# Patient Record
Sex: Male | Born: 1985 | Race: Black or African American | Hispanic: No | Marital: Single | Smoking: Never smoker
Health system: Southern US, Community
[De-identification: ages and names within clinical notes are randomized; demographics above are authoritative.]

## PROBLEM LIST (undated history)

## (undated) ENCOUNTER — Emergency Department (HOSPITAL_COMMUNITY): Discharge status: Absent without leave | Payer: Self-pay

---
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
## 2007-01-25 ENCOUNTER — Emergency Department (HOSPITAL_COMMUNITY): Admission: EM | Admit: 2007-01-25 | Discharge: 2007-01-25 | Payer: Self-pay | Admitting: Emergency Medicine

## 2008-08-16 ENCOUNTER — Emergency Department (HOSPITAL_COMMUNITY): Admission: EM | Admit: 2008-08-16 | Discharge: 2008-08-16 | Payer: Self-pay | Admitting: Emergency Medicine

## 2009-09-11 ENCOUNTER — Emergency Department (HOSPITAL_COMMUNITY): Admission: EM | Admit: 2009-09-11 | Discharge: 2009-09-11 | Payer: Self-pay | Admitting: Emergency Medicine

## 2011-04-20 NOTE — Op Note (Signed)
NAMEALONZO, OWCZARZAK             ACCOUNT NO.:  [id number]   MEDICAL RECORD NO.:  [id number]          PATIENT TYPE:  AMB   LOCATION:  DSC                          FACILITY:  [facility]   PHYSICIAN:  Angelia Mould. Derrell Lolling, M.D.DATE OF BIRTH:  [birth year]   DATE OF PROCEDURE:  05/01/2006  DATE OF DISCHARGE:                                 OPERATIVE REPORT   PREOPERATIVE DIAGNOSIS:  Soft tissue mass left thigh, 2.8 cm diameter.   POSTOPERATIVE DIAGNOSIS:  Soft tissue mass left thigh, 2.8 cm diameter.   OPERATION PERFORMED:  Excision soft tissue mass left thigh.   SURGEON:  Angelia Mould. Derrell Lolling, M.D.   OPERATIVE INDICATIONS:  This is a 25 year old black male who has a several-  month history of a protrusion in the skin on the upper inner left thigh.  It  has never been infected but has become more painful when he sits on it.  A  couple days ago it actually bled a little bit from the surface.  On exam, he  has what looks like about a 1.5 to 2.0 cm pedunculated mass on the upper  inner left side.  This has the physical characteristics of a benign lipoma  or perhaps a pedunculated cyst.  He is brought to the operating room for  excision.   OPERATIVE TECHNIQUE:  The patient was brought to the minor procedure room at  [facility]  He was placed in the right lateral decubitus  position with his thigh extended and the other thigh flexed. The area was  prepped and draped with Betadine.  1% Xylocaine with epinephrine was used as  a local infiltration anesthetic.  His mother was in attendance throughout  the procedure.  The obvious mass in the upper inner thigh was marked with a  marking pin in a transverse elliptical fashion. A transverse elliptical  incision was made.  The incision itself was probably about 3-1/2 to 4 cm  long and about 2 cm wide.  Dissection was carried down into the subcutaneous  tissue and the mass was completely excised.  There  was no signs of any deep  extension.   This was sent to pathology.  Hemostasis was excellent.  The skin  was closed with about 6 or 7 interrupted sutures of 3-0 and 4-0 nylon.  Clean bandages were placed.  The patient was returned to the waiting room in  excellent condition with his mother.  Estimated blood loss was about 5 mL.  Complications none. Sponge, needle and instrument counts were correct.      Angelia Mould. Derrell Lolling, M.D.  Electronically Signed     HMI/MEDQ  D:  05/01/2006  T:  05/01/2006  Job:  [id number]   cc:   Elvina Sidle, M.D.  Fax: [phone]

## 2014-05-23 ENCOUNTER — Emergency Department (HOSPITAL_COMMUNITY)
Admission: EM | Admit: 2014-05-23 | Discharge: 2014-05-23 | Disposition: A | Discharge status: Home / Self Care | Payer: Self-pay | Attending: Emergency Medicine | Admitting: Emergency Medicine

## 2014-05-23 ENCOUNTER — Encounter (HOSPITAL_COMMUNITY): Payer: Self-pay | Admitting: Emergency Medicine

## 2014-05-23 DIAGNOSIS — S0086XA Insect bite (nonvenomous) of other part of head, initial encounter: Secondary | ICD-10-CM

## 2014-05-23 DIAGNOSIS — Y939 Activity, unspecified: Secondary | ICD-10-CM | POA: Insufficient documentation

## 2014-05-23 DIAGNOSIS — Y929 Unspecified place or not applicable: Secondary | ICD-10-CM | POA: Insufficient documentation

## 2014-05-23 DIAGNOSIS — W57XXXA Bitten or stung by nonvenomous insect and other nonvenomous arthropods, initial encounter: Principal | ICD-10-CM | POA: Insufficient documentation

## 2014-05-23 DIAGNOSIS — S1096XA Insect bite of unspecified part of neck, initial encounter: Secondary | ICD-10-CM | POA: Insufficient documentation

## 2014-05-23 MED ORDER — CEPHALEXIN 500 MG PO CAPS: 500.0000 mg | ORAL_CAPSULE | Freq: Four times a day (QID) | ORAL | Status: DC

## 2014-05-23 NOTE — Discharge Instructions (Signed)
Recommend watchful waiting for 48 hours. If symptoms continue to worsen such as if you develop worsening swelling with redness, heat to the touch compared to surrounding skin, or puslike drainage, began antibiotics as prescribed. Followup with your primary care doctor as needed.  Insect Bite Mosquitoes, flies, fleas, bedbugs, and many other insects can bite. Insect bites are different from insect stings. A sting is when venom is injected into the skin. Some insect bites can transmit infectious diseases. SYMPTOMS  Insect bites usually turn red, swell, and itch for 2 to 4 days. They often go away on their own. TREATMENT  Your caregiver may prescribe antibiotic medicines if a bacterial infection develops in the bite. HOME CARE INSTRUCTIONS  Do not scratch the bite area.  Keep the bite area clean and dry. Wash the bite area thoroughly with soap and water.  Put ice or cool compresses on the bite area.  Put ice in a plastic bag.  Place a towel between your skin and the bag.  Leave the ice on for 20 minutes, 4 times a day for the first 2 to 3 days, or as directed.  You may apply a baking soda paste, cortisone cream, or calamine lotion to the bite area as directed by your caregiver. This can help reduce itching and swelling.  Only take over-the-counter or prescription medicines as directed by your caregiver.  If you are given antibiotics, take them as directed. Finish them even if you start to feel better. You may need a tetanus shot if:  You cannot remember when you had your last tetanus shot.  You have never had a tetanus shot.  The injury broke your skin. If you get a tetanus shot, your arm may swell, get red, and feel warm to the touch. This is common and not a problem. If you need a tetanus shot and you choose not to have one, there is a rare chance of getting tetanus. Sickness from tetanus can be serious. SEEK IMMEDIATE MEDICAL CARE IF:   You have increased pain, redness, or  swelling in the bite area.  You see a red line on the skin coming from the bite.  You have a fever.  You have joint pain.  You have a headache or neck pain.  You have unusual weakness.  You have a rash.  You have chest pain or shortness of breath.  You have abdominal pain, nausea, or vomiting.  You feel unusually tired or sleepy. MAKE SURE YOU:   Understand these instructions.  Will watch your condition.  Will get help right away if you are not doing well or get worse. Document Released: 12/27/2004 Document Revised: 02/11/2012 Document Reviewed: 06/20/2011 [facility] Patient Information 2015 [place] [place]. This information is not intended to replace advice given to you by your health care provider. Make sure you discuss any questions you have with your health care provider.

## 2014-05-23 NOTE — ED Notes (Signed)
Patient is alert and oriented x3.  He was given DC instructions and follow up visit instructions.  Patient gave verbal understanding.  He was DC ambulatory under his own power to home.  V/S stable.  He was not showing any signs of distress on DC 

## 2014-05-23 NOTE — ED Provider Notes (Signed)
CSN: [id number]     Arrival date & time 05/23/14  2051 History  This chart was scribed for non-physician practitioner, Antony MaduraKelly Karmelo Bass, PA-C working with Dagmar HaitWilliam Blair Walden, MD by Greggory StallionKayla Andersen, ED scribe. This patient was seen in room WTR8/WTR8 and the patient's care was started at 9:30 PM.    Chief Complaint  Patient presents with  . Insect Bite   The history is provided by the patient and a parent. No language interpreter was used.   HPI Comments: Chris DeedsJoel Galloway is a 28 y.o. male who presents to the Emergency Department complaining of an insect bite to his face that occurred about one hour ago. He has swelling and mild pain between his eyes. Pt's mother states that it was possibly a spider that bit him but pt is unsure. Pt states he noticed brown spiders in his garage before it happened. Denies vision changes, pain with eye movement, fever, headache, body aches. Pt is unsure of when his last tetanus was.   No past medical history on file. No past surgical history on file. History reviewed. No pertinent family history. History  Substance Use Topics  . Smoking status: Never Smoker   . Smokeless tobacco: Not on file  . Alcohol Use: Not on file    Review of Systems  Constitutional: Negative for fever.  HENT: Positive for facial swelling.   Eyes: Negative for visual disturbance.  Musculoskeletal: Negative for myalgias.  Neurological: Negative for headaches.  All other systems reviewed and are negative.   Allergies  Review of patient's allergies indicates no known allergies.  Home Medications   Prior to Admission medications   Medication Sig Start Date End Date Taking? Authorizing Provider  cephALEXin (KEFLEX) 500 MG capsule Take 1 capsule (500 mg total) by mouth 4 (four) times daily. Start on 05/26/14 if symptoms worsen or signs of infection develop 05/25/14   Antony MaduraKelly Lunah Losasso, PA-C   There were no vitals taken for this visit.  Physical Exam  Nursing note and vitals  reviewed. Constitutional: He is oriented to person, place, and time. He appears well-developed and well-nourished. No distress.  Nontoxic/nonseptic appearing.  HENT:  Head: Normocephalic and atraumatic.  Nose:    Eyes: Conjunctivae and EOM are normal. No scleral icterus.  Neck: Normal range of motion.  Cardiovascular: Normal rate, regular rhythm and intact distal pulses.   Distal radial pulses 2+ b/l  Pulmonary/Chest: Effort normal. No respiratory distress.  Musculoskeletal: Normal range of motion.  Neurological: He is alert and oriented to person, place, and time. He exhibits normal muscle tone. Coordination normal.  GCS 15. Patient moves extremities without ataxia.  Skin: Skin is warm and dry. No rash noted. He is not diaphoretic. No erythema. No pallor.  Psychiatric: He has a normal mood and affect. His behavior is normal.    ED Course  Procedures (including critical care time)  COORDINATION OF CARE: 9:39 PM-Discussed treatment plan which includes an antibiotic with pt at bedside and pt agreed to plan. Return precautions given. Advised pt to wait 48 hours to start the antibiotic to see if symptoms worsen.   Labs Review Labs Reviewed - No data to display  Imaging Review No results found.   EKG Interpretation None      MDM   Final diagnoses:  Bug bite of face without infection, initial encounter    Uncomplicated but bite date. Puncture wound suspected to be secondary to a spider bite. Bite occurred approximately one hour ago. Physical exam significant for mild soft tissue  swelling. No evidence of cellulitis or abscess. No ulceration of bite area. Patient well and nontoxic appearing. He denies fever. Do not see indication for antibiotics at this time; however, mother concerned about the development of severe infection. Have discussed plan of watchful waiting x48 hours. Will prescribe Keflex to be started 48 hours should symptoms severely worsen. Primary care followup  advised and return precautions provided. Patient agreeable to plan with no unaddressed concerns.  I personally performed the services described in this documentation, which was scribed in my presence. The recorded information has been reviewed and is accurate.   Filed Vitals:   05/23/14 2121  BP: 157/97  Pulse: 77  SpO2: 96%     Antony MaduraKelly Wetona Viramontes, PA-C 05/23/14 2154

## 2014-05-23 NOTE — ED Notes (Signed)
Patient is alert and oriented x3.  He is complaining of a bug bite to the face. Patient mother states that it was possibly a spider.  Currently patient has notable  Swelling to the facial area between his eye.

## 2014-05-23 NOTE — ED Provider Notes (Signed)
Medical screening examination/treatment/procedure(s) were performed by non-physician practitioner and as supervising physician I was immediately available for consultation/collaboration.   EKG Interpretation None        William Cloria Ciresi, MD 05/23/14 2319 

## 2015-10-17 ENCOUNTER — Emergency Department (INDEPENDENT_AMBULATORY_CARE_PROVIDER_SITE_OTHER)
Admission: EM | Admit: 2015-10-17 | Discharge: 2015-10-17 | Disposition: A | Discharge status: Home / Self Care | Payer: Self-pay | Source: Home / Self Care | Attending: Family Medicine | Admitting: Family Medicine

## 2015-10-17 ENCOUNTER — Encounter (HOSPITAL_COMMUNITY): Payer: Self-pay | Admitting: *Deleted

## 2015-10-17 DIAGNOSIS — K047 Periapical abscess without sinus: Secondary | ICD-10-CM

## 2015-10-17 MED ORDER — DICLOFENAC POTASSIUM 50 MG PO TABS: 50.0000 mg | ORAL_TABLET | Freq: Three times a day (TID) | ORAL | Status: DC

## 2015-10-17 MED ORDER — CLINDAMYCIN HCL 300 MG PO CAPS: 300.0000 mg | ORAL_CAPSULE | Freq: Three times a day (TID) | ORAL | Status: DC

## 2015-10-17 NOTE — Discharge Instructions (Signed)
Take medicine as prescribed, see your dentist as soon as possible °

## 2015-10-17 NOTE — ED Provider Notes (Signed)
CSN: [id number]     Arrival date & time 10/17/15  1516 History   First MD Initiated Contact with Patient 10/17/15 1717     Chief Complaint  Patient presents with  . Dental Problem   (Consider location/radiation/quality/duration/timing/severity/associated sxs/prior Treatment) Patient is a 29 y.o. male presenting with tooth pain. The history is provided by the patient.  Dental Pain Location:  Upper and generalized Upper teeth location: all of upper teeth poor cond. Quality:  Throbbing Severity:  Moderate Progression:  Worsening Chronicity:  New Context: dental caries and poor dentition   Relieved by:  None tried Worsened by:  Nothing tried Ineffective treatments:  None tried Associated symptoms: gum swelling   Associated symptoms: no facial swelling and no fever   Risk factors: lack of dental care and periodontal disease     No past medical history on file. No past surgical history on file. No family history on file. Social History  Substance Use Topics  . Smoking status: Never Smoker   . Smokeless tobacco: Not on file  . Alcohol Use: Not on file    Review of Systems  Constitutional: Negative for fever.  HENT: Positive for dental problem. Negative for facial swelling.   Hematological: Positive for adenopathy.  All other systems reviewed and are negative.   Allergies  Review of patient's allergies indicates no known allergies.  Home Medications   Prior to Admission medications   Medication Sig Start Date End Date Taking? Authorizing Provider  cephALEXin (KEFLEX) 500 MG capsule Take 1 capsule (500 mg total) by mouth 4 (four) times daily. Start on 05/26/14 if symptoms worsen or signs of infection develop 05/25/14   Antony MaduraKelly Humes, PA-C  clindamycin (CLEOCIN) 300 MG capsule Take 1 capsule (300 mg total) by mouth 3 (three) times daily. 10/17/15   Linna HoffJames D Kindl, MD  diclofenac (CATAFLAM) 50 MG tablet Take 1 tablet (50 mg total) by mouth 3 (three) times daily. 10/17/15   Linna HoffJames D  Kindl, MD   Meds Ordered and Administered this Visit  Medications - No data to display  BP 133/84 mmHg  Pulse 77  Temp(Src) 99.4 F (37.4 C) (Oral)  Resp 16  SpO2 100% No data found.   Physical Exam  Constitutional: He appears well-developed and well-nourished. No distress.  HENT:  Mouth/Throat: Uvula is midline. Abnormal dentition. Dental abscesses and dental caries present.  Nursing note and vitals reviewed.   ED Course  Procedures (including critical care time)  Labs Review Labs Reviewed - No data to display  Imaging Review No results found.   Visual Acuity Review  Right Eye Distance:   Left Eye Distance:   Bilateral Distance:    Right Eye Near:   Left Eye Near:    Bilateral Near:         MDM   1. Dental abscess        Linna HoffJames D Kindl, MD 10/17/15 [phone]

## 2015-10-17 NOTE — ED Notes (Signed)
Pt reports   Multiple       Dental  Caries      And  Pain  In   Mouth         X  3  Weeks  Unable  To  See  Dentist  Due  To  Finances

## 2017-04-18 ENCOUNTER — Encounter (HOSPITAL_COMMUNITY): Payer: Self-pay | Admitting: Emergency Medicine

## 2017-04-18 ENCOUNTER — Emergency Department (HOSPITAL_COMMUNITY)
Admission: EM | Admit: 2017-04-18 | Discharge: 2017-04-18 | Disposition: A | Discharge status: Home / Self Care | Payer: Self-pay | Attending: Emergency Medicine | Admitting: Emergency Medicine

## 2017-04-18 DIAGNOSIS — K0510 Chronic gingivitis, plaque induced: Secondary | ICD-10-CM | POA: Insufficient documentation

## 2017-04-18 DIAGNOSIS — K0889 Other specified disorders of teeth and supporting structures: Secondary | ICD-10-CM | POA: Insufficient documentation

## 2017-04-18 MED ORDER — NAPROXEN 250 MG PO TABS
375.0000 mg | ORAL_TABLET | Freq: Once | ORAL | Status: AC
  Administered 2017-04-18: 375 mg via ORAL
  Filled 2017-04-18: qty 2

## 2017-04-18 MED ORDER — PENICILLIN V POTASSIUM 500 MG PO TABS: 500.0000 mg | ORAL_TABLET | Freq: Four times a day (QID) | ORAL | 0 refills | Status: AC

## 2017-04-18 MED ORDER — NAPROXEN 375 MG PO TABS: 375.0000 mg | ORAL_TABLET | Freq: Two times a day (BID) | ORAL | 0 refills | Status: DC

## 2017-04-18 NOTE — ED Provider Notes (Signed)
[facility]-EMERGENCY DEPT Provider Note   CSN: [id number] Arrival date & time: 04/18/17  1324  By signing my name below, I, Sonum Patel, attest that this documentation has been prepared under the direction and in the presence of [employer]lett-[employer]ardlexandra Gadge Hermiz PA-C. Electronically Signed: Sonum Patel, Neurosurgeoncribe. 04/18/17. 2:29 PM.  History   Chief Complaint Chief Complaint  Patient presents with  . Dental Pain    The history is provided by the patient. No language interpreter was used.     HPI Comments: Chris Galloway is a 31 y.o. male who presents to the Emergency Department complaining of ongoing, worsening left sided upper and frontal dental pain for the past few months. He states he does not brush his teeth regularly as it causes his gums to bleed. He reports drinking 10 cups of soda daily. He has tried motrin, naproxen, and Tylenol without significant relief. He denies associated fever.   History reviewed. No pertinent past medical history.  There are no active problems to display for this patient.   History reviewed. No pertinent surgical history.     Home Medications    Prior to Admission medications   Medication Sig Start Date End Date Taking? Authorizing Provider  cephALEXin (KEFLEX) 500 MG capsule Take 1 capsule (500 mg total) by mouth 4 (four) times daily. Start on 05/26/14 if symptoms worsen or signs of infection develop 05/25/14   Antony MaduraHumes, Kelly, PA-C  clindamycin (CLEOCIN) 300 MG capsule Take 1 capsule (300 mg total) by mouth 3 (three) times daily. 10/17/15   Linna HoffKindl, James D, MD  diclofenac (CATAFLAM) 50 MG tablet Take 1 tablet (50 mg total) by mouth 3 (three) times daily. 10/17/15   Linna HoffKindl, James D, MD  naproxen (NAPROSYN) 375 MG tablet Take 1 tablet (375 mg total) by mouth 2 (two) times daily. 04/18/17   Itzayanna Kaster, Waylan BogaAlexandra M, PA-C  penicillin v potassium (VEETID) 500 MG tablet Take 1 tablet (500 mg total) by mouth 4 (four) times daily. 04/18/17 04/25/17  Emi HolesLaw, Tamaj Jurgens M, PA-C    Family  History History reviewed. No pertinent family history.  Social History Social History  Substance Use Topics  . Smoking status: Never Smoker  . Smokeless tobacco: Never Used  . Alcohol use No     Allergies   Patient has no known allergies.   Review of Systems Review of Systems  Constitutional: Negative for fever.  HENT: Positive for dental problem.      Physical Exam Updated Vital Signs BP (!) 154/99 (BP Location: Right Arm)   Pulse 69   Temp 98.6 F (37 C) (Oral)   Resp 18   SpO2 98%   Physical Exam  Constitutional: He appears well-developed and well-nourished. No distress.  HENT:  Head: Normocephalic and atraumatic.  Mouth/Throat: Oropharynx is clear and moist. Abnormal dentition. No oropharyngeal exudate.  No focal tooth worse than others. Generally poor dentition and erythematous gingiva   Eyes: Conjunctivae are normal. Pupils are equal, round, and reactive to light. Right eye exhibits no discharge. Left eye exhibits no discharge. No scleral icterus.  Neck: Normal range of motion. Neck supple. No thyromegaly present.  Cardiovascular: Normal rate, regular rhythm, normal heart sounds and intact distal pulses.  Exam reveals no gallop and no friction rub.   No murmur heard. Pulmonary/Chest: Effort normal and breath sounds normal. No stridor. No respiratory distress. He has no wheezes. He has no rales.  Abdominal: Soft. He exhibits no distension. There is no tenderness. There is no rebound and no guarding.  Musculoskeletal: He  exhibits no edema.  Lymphadenopathy:    He has no cervical adenopathy.  Neurological: He is alert. Coordination normal.  Skin: Skin is warm and dry. No rash noted. He is not diaphoretic. No pallor.  Psychiatric: He has a normal mood and affect.  Nursing note and vitals reviewed.    ED Treatments / Results  DIAGNOSTIC STUDIES: Oxygen Saturation is 98% on RA, nml by my interpretation.    COORDINATION OF CARE: 2:25 PM Discussed treatment  plan with pt at bedside and pt agreed to plan.   Labs (all labs ordered are listed, but only abnormal results are displayed) Labs Reviewed - No data to display  EKG  EKG Interpretation None       Radiology No results found.  Procedures Procedures (including critical care time)  Medications Ordered in ED Medications  naproxen (NAPROSYN) tablet 375 mg (not administered)     Initial Impression / Assessment and Plan / ED Course  I have reviewed the triage vital signs and the nursing notes.  Pertinent labs & imaging results that were available during my care of the patient were reviewed by me and considered in my medical decision making (see chart for details).     Patient with dentalgia, generalized tooth decay and gingival disease.  No abscess requiring immediate incision and drainage.  Exam not concerning for Ludwig's angina or pharyngeal abscess.  Will treat with penicillin. Pt instructed to follow-up with dentist as soon as possible. I discussed diet and lifestyle changes extensively with patient. He understands and agrees with plan. Discussed return precautions. Pt safe for discharge.   Final Clinical Impressions(s) / ED Diagnoses   Final diagnoses:  Pain, dental  Gingivitis    New Prescriptions New Prescriptions   NAPROXEN (NAPROSYN) 375 MG TABLET    Take 1 tablet (375 mg total) by mouth 2 (two) times daily.   PENICILLIN V POTASSIUM (VEETID) 500 MG TABLET    Take 1 tablet (500 mg total) by mouth 4 (four) times daily.   I personally performed the services described in this documentation, which was scribed in my presence. The recorded information has been reviewed and is accurate.    Emi Holes, PA-C 04/18/17 1504    Tilden Fossa, MD 04/18/17 2105

## 2017-04-18 NOTE — ED Triage Notes (Signed)
Pt sts upper dental pain

## 2017-04-18 NOTE — Discharge Instructions (Signed)
Medications: Penicillin, Naprosyn  Treatment: Take penicillin 4 times daily for 1 week. Make sure to finish all this medication. Take Naprosyn twice daily as needed for your pain. You can alternate with Tylenol as prescribed over-the-counter.  Follow-up: Please follow-up with a dentist as soon as possible for further evaluation and treatment of your dental pain and gingival disease. Please follow up and establish care with the primary care provider by calling the numbers circled on your discharge paperwork.

## 2017-10-31 ENCOUNTER — Encounter (HOSPITAL_COMMUNITY)
Admission: EM | Disposition: A | Discharge status: Home / Self Care | Payer: Self-pay | Source: Home / Self Care | Attending: Emergency Medicine

## 2017-10-31 ENCOUNTER — Other Ambulatory Visit: Payer: Self-pay

## 2017-10-31 ENCOUNTER — Emergency Department (HOSPITAL_COMMUNITY)
Admission: EM | Admit: 2017-10-31 | Discharge: 2017-10-31 | Disposition: A | Discharge status: Home / Self Care | Payer: Self-pay | Attending: Emergency Medicine | Admitting: Emergency Medicine

## 2017-10-31 ENCOUNTER — Emergency Department (HOSPITAL_COMMUNITY): Payer: Self-pay

## 2017-10-31 ENCOUNTER — Encounter (HOSPITAL_COMMUNITY): Payer: Self-pay | Admitting: Certified Registered"

## 2017-10-31 ENCOUNTER — Encounter (HOSPITAL_COMMUNITY): Payer: Self-pay

## 2017-10-31 DIAGNOSIS — W4904XA Ring or other jewelry causing external constriction, initial encounter: Secondary | ICD-10-CM | POA: Insufficient documentation

## 2017-10-31 DIAGNOSIS — Z791 Long term (current) use of non-steroidal anti-inflammatories (NSAID): Secondary | ICD-10-CM | POA: Insufficient documentation

## 2017-10-31 DIAGNOSIS — L923 Foreign body granuloma of the skin and subcutaneous tissue: Secondary | ICD-10-CM | POA: Insufficient documentation

## 2017-10-31 DIAGNOSIS — S61224A Laceration with foreign body of right ring finger without damage to nail, initial encounter: Secondary | ICD-10-CM

## 2017-10-31 SURGERY — IRRIGATION AND DEBRIDEMENT EXTREMITY
Anesthesia: Choice | Laterality: Right

## 2017-10-31 MED ORDER — BUPIVACAINE HCL (PF) 0.5 % IJ SOLN
10.0000 mL | Freq: Once | INTRAMUSCULAR | Status: AC
  Administered 2017-10-31: 10 mL
  Filled 2017-10-31: qty 10

## 2017-10-31 MED ORDER — BUPIVACAINE HCL 0.5 % IJ SOLN
10.0000 mL | Freq: Once | INTRAMUSCULAR | Status: DC
  Filled 2017-10-31 (×2): qty 10

## 2017-10-31 MED ORDER — LACTATED RINGERS IV SOLN: INTRAVENOUS | Status: DC

## 2017-10-31 MED ORDER — SULFAMETHOXAZOLE-TRIMETHOPRIM 800-160 MG PO TABS: 1.0000 | ORAL_TABLET | Freq: Two times a day (BID) | ORAL | 0 refills | Status: DC

## 2017-10-31 MED ORDER — TETANUS-DIPHTH-ACELL PERTUSSIS 5-2.5-18.5 LF-MCG/0.5 IM SUSP
0.5000 mL | Freq: Once | INTRAMUSCULAR | Status: AC
  Administered 2017-10-31: 0.5 mL via INTRAMUSCULAR
  Filled 2017-10-31: qty 0.5

## 2017-10-31 SURGICAL SUPPLY — 44 items
BANDAGE ACE 3X5.8 VEL STRL LF (GAUZE/BANDAGES/DRESSINGS) ×2 IMPLANT
BANDAGE ACE 4X5 VEL STRL LF (GAUZE/BANDAGES/DRESSINGS) ×2 IMPLANT
BANDAGE COBAN STERILE 2 (GAUZE/BANDAGES/DRESSINGS) IMPLANT
BNDG ESMARK 4X9 LF (GAUZE/BANDAGES/DRESSINGS) IMPLANT
BNDG GAUZE ELAST 4 BULKY (GAUZE/BANDAGES/DRESSINGS) ×2 IMPLANT
CORDS BIPOLAR (ELECTRODE) ×2 IMPLANT
COVER SURGICAL LIGHT HANDLE (MISCELLANEOUS) ×2 IMPLANT
CUFF TOURNIQUET SINGLE 18IN (TOURNIQUET CUFF) IMPLANT
CUFF TOURNIQUET SINGLE 24IN (TOURNIQUET CUFF) IMPLANT
DECANTER SPIKE VIAL GLASS SM (MISCELLANEOUS) ×2 IMPLANT
DRAIN PENROSE 1/4X12 LTX STRL (WOUND CARE) IMPLANT
DRSG PAD ABDOMINAL 8X10 ST (GAUZE/BANDAGES/DRESSINGS) ×4 IMPLANT
GAUZE SPONGE 4X4 12PLY STRL (GAUZE/BANDAGES/DRESSINGS) ×2 IMPLANT
GAUZE XEROFORM 1X8 LF (GAUZE/BANDAGES/DRESSINGS) ×2 IMPLANT
GLOVE BIO SURGEON STRL SZ7.5 (GLOVE) ×2 IMPLANT
GLOVE BIOGEL PI IND STRL 8 (GLOVE) ×1 IMPLANT
GLOVE BIOGEL PI INDICATOR 8 (GLOVE) ×1
GOWN STRL REUS W/ TWL LRG LVL3 (GOWN DISPOSABLE) ×1 IMPLANT
GOWN STRL REUS W/ TWL XL LVL3 (GOWN DISPOSABLE) ×1 IMPLANT
GOWN STRL REUS W/TWL LRG LVL3 (GOWN DISPOSABLE) ×1
GOWN STRL REUS W/TWL XL LVL3 (GOWN DISPOSABLE) ×1
KIT BASIN OR (CUSTOM PROCEDURE TRAY) ×2 IMPLANT
KIT ROOM TURNOVER OR (KITS) ×2 IMPLANT
LOOP VESSEL MAXI BLUE (MISCELLANEOUS) IMPLANT
MANIFOLD NEPTUNE II (INSTRUMENTS) IMPLANT
NEEDLE HYPO 25X1 1.5 SAFETY (NEEDLE) IMPLANT
NS IRRIG 1000ML POUR BTL (IV SOLUTION) ×2 IMPLANT
PACK ORTHO EXTREMITY (CUSTOM PROCEDURE TRAY) ×2 IMPLANT
PAD ARMBOARD 7.5X6 YLW CONV (MISCELLANEOUS) ×4 IMPLANT
SCRUB BETADINE 4OZ XXX (MISCELLANEOUS) ×2 IMPLANT
SET CYSTO W/LG BORE CLAMP LF (SET/KITS/TRAYS/PACK) IMPLANT
SOL PREP POV-IOD 4OZ 10% (MISCELLANEOUS) ×2 IMPLANT
SPONGE LAP 4X18 X RAY DECT (DISPOSABLE) ×2 IMPLANT
SUT ETHILON 4 0 P 3 18 (SUTURE) IMPLANT
SUT ETHILON 4 0 PS 2 18 (SUTURE) IMPLANT
SUT MON AB 5-0 P3 18 (SUTURE) IMPLANT
SWAB COLLECTION DEVICE MRSA (MISCELLANEOUS) IMPLANT
SWAB CULTURE ESWAB REG 1ML (MISCELLANEOUS) IMPLANT
SYR CONTROL 10ML LL (SYRINGE) IMPLANT
TOWEL OR 17X26 10 PK STRL BLUE (TOWEL DISPOSABLE) ×2 IMPLANT
TUBE CONNECTING 12X1/4 (SUCTIONS) ×2 IMPLANT
TUBE FEEDING ENTERAL 5FR 16IN (TUBING) IMPLANT
UNDERPAD 30X30 (UNDERPADS AND DIAPERS) ×2 IMPLANT
YANKAUER SUCT BULB TIP NO VENT (SUCTIONS) ×2 IMPLANT

## 2017-10-31 NOTE — ED Triage Notes (Signed)
Patient here to have ring cut from right hand index finger, ring stuck for months. Digit warm and no delay in cap refill noted  to nail bed

## 2017-10-31 NOTE — ED Notes (Signed)
Pt made aware of NPO.

## 2017-10-31 NOTE — Progress Notes (Signed)
D/c instructions reviewed with patient. Verbalized understanding. Patient awaiting brother to arrive to transport home.

## 2017-10-31 NOTE — ED Provider Notes (Signed)
[facility] EMERGENCY DEPARTMENT Provider Note   CSN: [id number] Arrival date & time: 10/31/17  1010     History   Chief Complaint No chief complaint on file.   HPI Chris Galloway is a 31 y.o. male.  HPI   Chris Galloway is a 31 y.o. male, patient with no pertinent past medical history, presenting to the ED with a ring stuck on the right ring finger for the past month.  Patient states the ring hold sentimental value, especially after his mother recently passed away.  He states, "I put the ring on my finger because I wanted to wear it, especially after my mom died.  I just forgot that I gained some weight since I last wore it in high school."  Patient denies pain at rest, but severe pain with movement of the finger.  Denies fever, pain or swelling in the hand, numbness, weakness, or any other complaints. Last food intake was 8 AM this morning.  History reviewed. No pertinent past medical history.  There are no active problems to display for this patient.   History reviewed. No pertinent surgical history.     Home Medications    Prior to Admission medications   Medication Sig Start Date End Date Taking? Authorizing Provider  cephALEXin (KEFLEX) 500 MG capsule Take 1 capsule (500 mg total) by mouth 4 (four) times daily. Start on 05/26/14 if symptoms worsen or signs of infection develop 05/25/14   Antony Madura, PA-C  clindamycin (CLEOCIN) 300 MG capsule Take 1 capsule (300 mg total) by mouth 3 (three) times daily. 10/17/15   Linna Hoff, MD  diclofenac (CATAFLAM) 50 MG tablet Take 1 tablet (50 mg total) by mouth 3 (three) times daily. 10/17/15   Linna Hoff, MD  naproxen (NAPROSYN) 375 MG tablet Take 1 tablet (375 mg total) by mouth 2 (two) times daily. 04/18/17   Emi Holes, PA-C    Family History No family history on file.  Social History Social History   Tobacco Use  . Smoking status: Never Smoker  . Smokeless tobacco: Never Used    Substance Use Topics  . Alcohol use: No  . Drug use: No     Allergies   Patient has no known allergies.   Review of Systems Review of Systems  Constitutional: Negative for fever.  Skin: Positive for wound.       Ring stuck on the right ring finger  Neurological: Negative for weakness and numbness.     Physical Exam Updated Vital Signs BP (!) 166/111 (BP Location: Left Arm)   Pulse (!) 103   Temp 98.2 F (36.8 C) (Oral)   Resp 16   Ht 6' (1.829 m)   SpO2 99%   Physical Exam  Constitutional: He appears well-developed and well-nourished. No distress.  HENT:  Head: Normocephalic and atraumatic.  Eyes: Conjunctivae are normal.  Neck: Neck supple.  Cardiovascular: Normal rate, regular rhythm and intact distal pulses.  Pulmonary/Chest: Effort normal.  Musculoskeletal: He exhibits tenderness.  Large class ring positioned near the MCP joint of the right ring finger.  There appears to be swelling, erythema, and ecchymosis in the tissue surrounding the ring.  Purulent discharge and foul smell also noted from open wound adjacent to the ring.  Full range of motion at the DIP and PIP joints of the right ring finger. No tenderness proximal to the MCP joint.  Full range of motion in the DIP, PIP, and MCP joints of the rest of  the fingers of the right hand as well as in the right wrist.  Neurological: He is alert.  No noted sensory deficits to the right ring finger.  Flexion and extension intact against resistance at the right fourth DIP, PIP, and MCP joints.  Skin: Skin is warm and dry. Capillary refill takes less than 2 seconds. He is not diaphoretic. No pallor.  Good capillary refill in the distal tip of the right ring finger.  The finger is appropriately warm.  Psychiatric: He has a normal mood and affect. His behavior is normal.  Nursing note and vitals reviewed.                  ED Treatments / Results  Labs (all labs ordered are listed, but only abnormal  results are displayed) Labs Reviewed - No data to display  EKG  EKG Interpretation None       Radiology Dg Hand Complete Right  Result Date: 10/31/2017 CLINICAL DATA:  Has had class ring stuck on finger for 1 month EXAM: RIGHT HAND - COMPLETE 3+ VIEW COMPARISON:  None FINDINGS: Jewelry artifact at proximal phalanx RIGHT ring finger. Soft tissue swelling RIGHT ring finger centered at PIP joint. Osseous mineralization normal. Joint spaces preserved. No acute fracture, dislocation, or bone destruction. Mild dorsal soft tissue swelling RIGHT hand overlying the MCP joints. IMPRESSION: No acute osseous abnormalities. Electronically Signed   By: Ulyses SouthwardMark  Boles M.D.   On: 10/31/2017 15:48    Procedures .Nerve Block Date/Time: 10/31/2017 1:25 PM Performed by: Anselm PancoastJoy, Shawn C, PA-C Authorized by: Anselm PancoastJoy, Shawn C, PA-C   Consent:    Consent obtained:  Verbal   Consent given by:  Patient Indications:    Indications:  Pain relief and procedural anesthesia Location:    Body area:  Upper extremity   Upper extremity nerve:  Metacarpal   Laterality:  Right Pre-procedure details:    Skin preparation:  Alcohol Procedure details (see MAR for exact dosages):    Block needle gauge:  27 G   Anesthetic injected:  Bupivacaine 0.5% w/o epi   Injection procedure:  Anatomic landmarks identified, anatomic landmarks palpated, incremental injection, introduced needle and negative aspiration for blood Post-procedure details:    Outcome:  Anesthesia achieved   Patient tolerance of procedure:  Tolerated well, no immediate complications     (including critical care time)  Medications Ordered in ED Medications  Tdap (BOOSTRIX) injection 0.5 mL (0.5 mLs Intramuscular Given 10/31/17 1230)  bupivacaine (MARCAINE) 0.5 % injection 10 mL (10 mLs Infiltration Given 10/31/17 1319)     Initial Impression / Assessment and Plan / ED Course  I have reviewed the triage vital signs and the nursing notes.  Pertinent  labs & imaging results that were available during my care of the patient were reviewed by me and considered in my medical decision making (see chart for details).  Clinical Course as of Oct 31 1656  Thu Oct 31, 2017  1245 Reassessed patient while waiting on appropriate medication to come to the bedside for digital block. No increase in pain, no decrease in range of motion.  Patient still neurovascularly intact.  [SJ]  1325 Finger reassessed.  Continues to be neurovascularly intact.  [SJ]  1345 Attempted to remove the ring using manual ring cutter.  Electric ring cutter out of order.  Checked with other areas of the ED as well as the OR.  No additional ring cutters available. Patient continues to be neurovascularly intact. Patient previously did not know the  material from which the ring was made.  Now states that he thinks it is made of titanium.  [SJ]  1425 Continues to be neurovascularly intact.  [SJ]  1509 Spoke with Larita FifeLynn with Dr. Merrilee SeashoreKuzma's office. States she will speak with Dr. Merlyn LotKuzma and call back.  [SJ]  1520 Lynn requesting a xray of the hand. Xray not originally ordered due to the thought that the ring would obscure the view of any osseous structures.  [SJ]  [id number] Spoke with Dr. Merlyn LotKuzma. States he will take patient to OR for ring removal.  [SJ]    Clinical Course User Index [SJ] Joy, Shawn C, PA-C     Patient presents with a ring embedded in his finger.  Unable to remove the ring in the ED.  Patient remained neurovascularly intact throughout ED course.  Patient taken to the OR via hand surgery for ring removal.  Final Clinical Impressions(s) / ED Diagnoses   Final diagnoses:  Laceration of right ring finger with foreign body without damage to nail, initial encounter    ED Discharge Orders    None       Concepcion LivingJoy, Shawn C, PA-C 10/31/17 1659    Anselm PancoastJoy, Shawn C, PA-C 10/31/17 1700    Gerhard MunchLockwood, Robert, MD 11/01/17 [phone]

## 2017-10-31 NOTE — Consult Note (Signed)
Chris DeedsJoel Galloway is an 31 y.o. male.   Chief Complaint: embedded ring right ring finger HPI: 31 yo rhd male present with brother states the ring on his right ring finger has become embedded in the skin and he can't remove it.  Has been an issue for approximately one month.  No pain at rest.  Pain aggravated with motion/palpation and alleviated with rest.  No fevers, chills, night sweats.  Seen by ED staff and attempts at ring removal unsuccessful.  Case discussed with Chris RutherfordShawn Galloway, [facility] and his note from 10/31/2017 reviewed. Xrays viewed and interpreted by me: 3 veiws right ring finger show no fractures, dislocations.  Large radioopaque foreign body.  No bony changes noted. Labs reviewed: none  Allergies: No Known Allergies  History reviewed. No pertinent past medical history.  History reviewed. No pertinent surgical history.  Family History: History reviewed. No pertinent family history.  Social History:   reports that  has never smoked. he has never used smokeless tobacco. He reports that he does not drink alcohol or use drugs.  Medications: Medications Prior to Admission  Medication Sig Dispense Refill  . cephALEXin (KEFLEX) 500 MG capsule Take 1 capsule (500 mg total) by mouth 4 (four) times daily. Start on 05/26/14 if symptoms worsen or signs of infection develop 28 capsule 0  . clindamycin (CLEOCIN) 300 MG capsule Take 1 capsule (300 mg total) by mouth 3 (three) times daily. 21 capsule 0  . diclofenac (CATAFLAM) 50 MG tablet Take 1 tablet (50 mg total) by mouth 3 (three) times daily. 21 tablet 0  . naproxen (NAPROSYN) 375 MG tablet Take 1 tablet (375 mg total) by mouth 2 (two) times daily. 20 tablet 0    No results found for this or any previous visit (from the past 48 hour(s)).  Dg Hand Complete Right  Result Date: 10/31/2017 CLINICAL DATA:  Has had class ring stuck on finger for 1 month EXAM: RIGHT HAND - COMPLETE 3+ VIEW COMPARISON:  None FINDINGS: Jewelry artifact at proximal  phalanx RIGHT ring finger. Soft tissue swelling RIGHT ring finger centered at PIP joint. Osseous mineralization normal. Joint spaces preserved. No acute fracture, dislocation, or bone destruction. Mild dorsal soft tissue swelling RIGHT hand overlying the MCP joints. IMPRESSION: No acute osseous abnormalities. Electronically Signed   By: Chris SouthwardMark  Galloway M.D.   On: 10/31/2017 15:48     A comprehensive review of systems was negative. Review of Systems: No fevers, chills, night sweats, chest pain, shortness of breath, nausea, vomiting, diarrhea, constipation, easy bleeding or bruising, headaches, dizziness, vision changes, fainting.   Blood pressure (!) 166/111, pulse 99, temperature 98 F (36.7 C), temperature source Oral, resp. rate 16, height 6' (1.829 m), SpO2 99 %.  General appearance: alert, cooperative and appears stated age Head: Normocephalic, without obvious abnormality, atraumatic Neck: supple, symmetrical, trachea midline Extremities: Intact sensation and capillary refill all digits except right ring with decreased sensation due to digital block.  States sensation was normal prior to block.  +epl/fpl/io.  Brisk capillary refill right ring finger.  Able to flex dip joint.  Large class ring embedded in volar tissues. Pulses: 2+ and symmetric Skin: Skin color, texture, turgor normal. No rashes or lesions Neurologic: Grossly normal Incision/Wound: As above  Assessment/Plan Right ring finger with embedded ring.  Recommend removal of ring.  Planned for OR for removal due to embedded nature.  New ring cutter with fresh wheel tried in preop holding.  Procedure note: Informed consent obtained for removal of ring.  In preop holding, a new ring cutter with a fresh wheel was used to cut the ring.  Ring able to be spread and removed from right ring finger.  Dorsal tissues intact.  Volar skin eroded with granulation beneath.  No exposed tendon or neurovascular bundle.  No purulence, but malodorous.   Able to extend finger fully.  Wound irrigated with sterile saline and dressed with sterile xeroform, 4x4, and lightly wrapped with coban dressing.  Will see in office early next week for wound care.  Bactrim DS 1 PO BID x 7 days.  Patient states finger not painful and does not need pain medication.  Will use antiinflammatory as necessary.  He tolerated procedure well.  Chris Galloway R 10/31/2017, 6:11 PM

## 2017-10-31 NOTE — ED Notes (Signed)
Ring cutter at bedside if needed.

## 2017-10-31 NOTE — Discharge Instructions (Signed)

## 2017-10-31 NOTE — ED Notes (Signed)
Ice pack applied to hand.

## 2018-05-05 ENCOUNTER — Emergency Department (HOSPITAL_COMMUNITY)
Admission: EM | Admit: 2018-05-05 | Discharge: 2018-05-06 | Disposition: A | Discharge status: Home / Self Care | Payer: Self-pay | Attending: Emergency Medicine | Admitting: Emergency Medicine

## 2018-05-05 ENCOUNTER — Encounter (HOSPITAL_COMMUNITY): Payer: Self-pay

## 2018-05-05 ENCOUNTER — Emergency Department (HOSPITAL_COMMUNITY): Payer: Self-pay

## 2018-05-05 DIAGNOSIS — R079 Chest pain, unspecified: Secondary | ICD-10-CM | POA: Insufficient documentation

## 2018-05-05 DIAGNOSIS — F4329 Adjustment disorder with other symptoms: Secondary | ICD-10-CM | POA: Insufficient documentation

## 2018-05-05 LAB — BASIC METABOLIC PANEL
ANION GAP: 8 (ref 5–15)
BUN: 6 mg/dL (ref 6–20)
CO2: 26 mmol/L (ref 22–32)
Calcium: 9.2 mg/dL (ref 8.9–10.3)
Chloride: 107 mmol/L (ref 101–111)
Creatinine, Ser: 1.15 mg/dL (ref 0.61–1.24)
GFR calc Af Amer: >60 mL/min (ref >60)
GFR calc non Af Amer: >60 mL/min (ref >60)
GLUCOSE: 72 mg/dL (ref 65–99)
Potassium: 3.2 mmol/L — ABNORMAL LOW (ref 3.5–5.1)
Sodium: 141 mmol/L (ref 135–145)

## 2018-05-05 LAB — CBC
HEMATOCRIT: 49.2 % (ref 39.0–52.0)
HEMOGLOBIN: 17.2 g/dL — AB (ref 13.0–17.0)
MCH: 30.4 pg (ref 26.0–34.0)
MCHC: 35 g/dL (ref 30.0–36.0)
MCV: 87.1 fL (ref 78.0–100.0)
Platelets: 246 10*3/uL (ref 150–400)
RBC: 5.65 MIL/uL (ref 4.22–5.81)
RDW: 12.5 % (ref 11.5–15.5)
WBC: 9 10*3/uL (ref 4.0–10.5)

## 2018-05-05 LAB — I-STAT TROPONIN, ED: TROPONIN I, POC: 0 ng/mL (ref 0.00–0.08)

## 2018-05-05 MED ORDER — POTASSIUM CHLORIDE CRYS ER 20 MEQ PO TBCR
40.0000 meq | EXTENDED_RELEASE_TABLET | Freq: Once | ORAL | Status: AC
  Administered 2018-05-05: 40 meq via ORAL
  Filled 2018-05-05: qty 2

## 2018-05-05 NOTE — ED Provider Notes (Signed)
[facility] EMERGENCY DEPARTMENT Provider Note   CSN: [id number] Arrival date & time: 05/05/18  1953     History   Chief Complaint Chief Complaint  Patient presents with  . Chest Pain    HPI Chris Galloway is a 32 y.o. male.  Patient with no known past medical history presents to the emergency department with a chief complaint of chest pains.  He states that he has been having the chest pain since 2015.  He states that they have been increasingly frequent in the past several months and weeks.  He states that he lost his mother last year.  He cares for the rest of his siblings.  He is also working a lot.  He denies any cough, fever, or shortness of breath.  He states that he does feel stressed and is more worried at night.  There are no other associated symptoms.  The history is provided by the patient. No language interpreter was used.    History reviewed. No pertinent past medical history.  There are no active problems to display for this patient.   History reviewed. No pertinent surgical history.      Home Medications    Prior to Admission medications   Medication Sig Start Date End Date Taking? Authorizing Provider  naproxen (NAPROSYN) 375 MG tablet Take 1 tablet (375 mg total) by mouth 2 (two) times daily. Patient not taking: Reported on 05/05/2018 04/18/17   Emi Holes, PA-C    Family History No family history on file.  Social History Social History   Tobacco Use  . Smoking status: Never Smoker  . Smokeless tobacco: Never Used  Substance Use Topics  . Alcohol use: No  . Drug use: No     Allergies   Patient has no known allergies.   Review of Systems Review of Systems  All other systems reviewed and are negative.    Physical Exam Updated Vital Signs BP (!) 146/101 (BP Location: Right Arm)   Pulse 63   Temp 98.2 F (36.8 C) (Oral)   Resp 18   SpO2 99%   Physical Exam  Constitutional: He is oriented to person, place,  and time. He appears well-developed and well-nourished.  HENT:  Head: Normocephalic and atraumatic.  Eyes: Pupils are equal, round, and reactive to light. Conjunctivae and EOM are normal. Right eye exhibits no discharge. Left eye exhibits no discharge. No scleral icterus.  Neck: Normal range of motion. Neck supple. No JVD present.  Cardiovascular: Normal rate, regular rhythm and normal heart sounds. Exam reveals no gallop and no friction rub.  No murmur heard. Pulmonary/Chest: Effort normal and breath sounds normal. No respiratory distress. He has no wheezes. He has no rales. He exhibits no tenderness.  Abdominal: Soft. He exhibits no distension and no mass. There is no tenderness. There is no rebound and no guarding.  Musculoskeletal: Normal range of motion. He exhibits no edema or tenderness.  Neurological: He is alert and oriented to person, place, and time.  Skin: Skin is warm and dry.  Psychiatric: He has a normal mood and affect. His behavior is normal. Judgment and thought content normal.  Nursing note and vitals reviewed.    ED Treatments / Results  Labs (all labs ordered are listed, but only abnormal results are displayed) Labs Reviewed  BASIC METABOLIC PANEL - Abnormal; Notable for the following components:      Result Value   Potassium 3.2 (*)    All other components within normal  limits  CBC - Abnormal; Notable for the following components:   Hemoglobin 17.2 (*)    All other components within normal limits  I-STAT TROPONIN, ED    EKG None  Radiology Dg Chest 2 View  Result Date: 05/05/2018 CLINICAL DATA:  Chest pain. EXAM: CHEST - 2 VIEW COMPARISON:  None. FINDINGS: Low lung volumes leading to bronchovascular crowding. Upper normal heart size likely accentuated by hypoaeration. No consolidation, pleural effusion, or pneumothorax. No acute osseous abnormalities are seen. Incidental air-filled colon in the left upper abdomen. IMPRESSION: Hypoventilatory chest with  bronchovascular crowding. Upper normal heart size likely accentuated by hypoaeration. Electronically Signed   By: Rubye OaksMelanie  Ehinger M.D.   On: 05/05/2018 20:49    Procedures Procedures (including critical care time)  Medications Ordered in ED Medications  potassium chloride SA (K-DUR,KLOR-CON) CR tablet 40 mEq (has no administration in time range)     Initial Impression / Assessment and Plan / ED Course  I have reviewed the triage vital signs and the nursing notes.  Pertinent labs & imaging results that were available during my care of the patient were reviewed by me and considered in my medical decision making (see chart for details).     Patient with intermittent chest pains since 2015.  They have been increasingly frequent.  Patient is under a lot of stress.  Recent death of his mother.  He cares for his siblings.  He is also working a lot.  I believe his symptoms to be anxiety and stress related.  His troponin is negative, EKG is normal.  He is thought to be low risk for ACS given age and risk factors.  He does have an elevated blood pressure reading today, I have encouraged him to follow-up with a primary care doctor, as he may need treatment.  His potassium is a little low, I will replace this in the emergency department.  I doubt PE or DVT.  He is not hypoxic nor tachycardic.  Plan is for primary care follow-up.  Will give primary care resources.  Discharged home.  Final Clinical Impressions(s) / ED Diagnoses   Final diagnoses:  Chest pain, unspecified type  Stress and adjustment reaction    ED Discharge Orders    None       Roxy HorsemanBrowning, Saida Lonon, PA-C 05/05/18 2323    Shon BatonHorton, Courtney F, MD 05/06/18 [id number]

## 2018-05-05 NOTE — ED Triage Notes (Signed)
Pt states that he has been having CP that has been going on for months, today was worse with SOB, denies other cardiac symptoms.

## 2018-05-05 NOTE — ED Provider Notes (Signed)
Patient placed in Quick Look pathway, seen and evaluated   Chief Complaint: chest pain  HPI:   Pt states he has had intermittent chest pains for multiple months now.  He states he feels as though they have been worsening since the passing of his mother.  He states he feels as though he cannot relax, and begins feeling anxious and shortly following the chest pains began.  States he does feel short of breath with the chest pains.  Denies abdominal complaints.  No cardiac history.  No history of DVT or PE.  Denies SI or HI.  ROS: + CP  Physical Exam:   Gen: No distress  Neuro: Awake and Alert  Skin: Warm    Focused Exam: heart sounds normal, lungs CTAB.   Initiation of care has begun. The patient has been counseled on the process, plan, and necessity for staying for the completion/evaluation, and the remainder of the medical screening examination    Robinson, [place] N, PA-C 05/05/18 2013    Little, Ambrose Finlandachel Morgan, MD 05/06/18 1511

## 2018-05-05 NOTE — Discharge Instructions (Addendum)
Please follow-up with the primary care group listed above.  You need to be seen by a primary care doctor in the next month.  You may need to be started on blood pressure medication, but this will need to be evaluated after having a couple more blood pressure readings.  Please return to the emergency department for any other new or worsening symptoms.

## 2018-11-10 IMAGING — CR DG CHEST 2V
2 series · 2 of 2 positions shown · non-contrast
Comparison: None.

CLINICAL DATA: Chest pain.

EXAM:
CHEST - 2 VIEW

[chest pa]
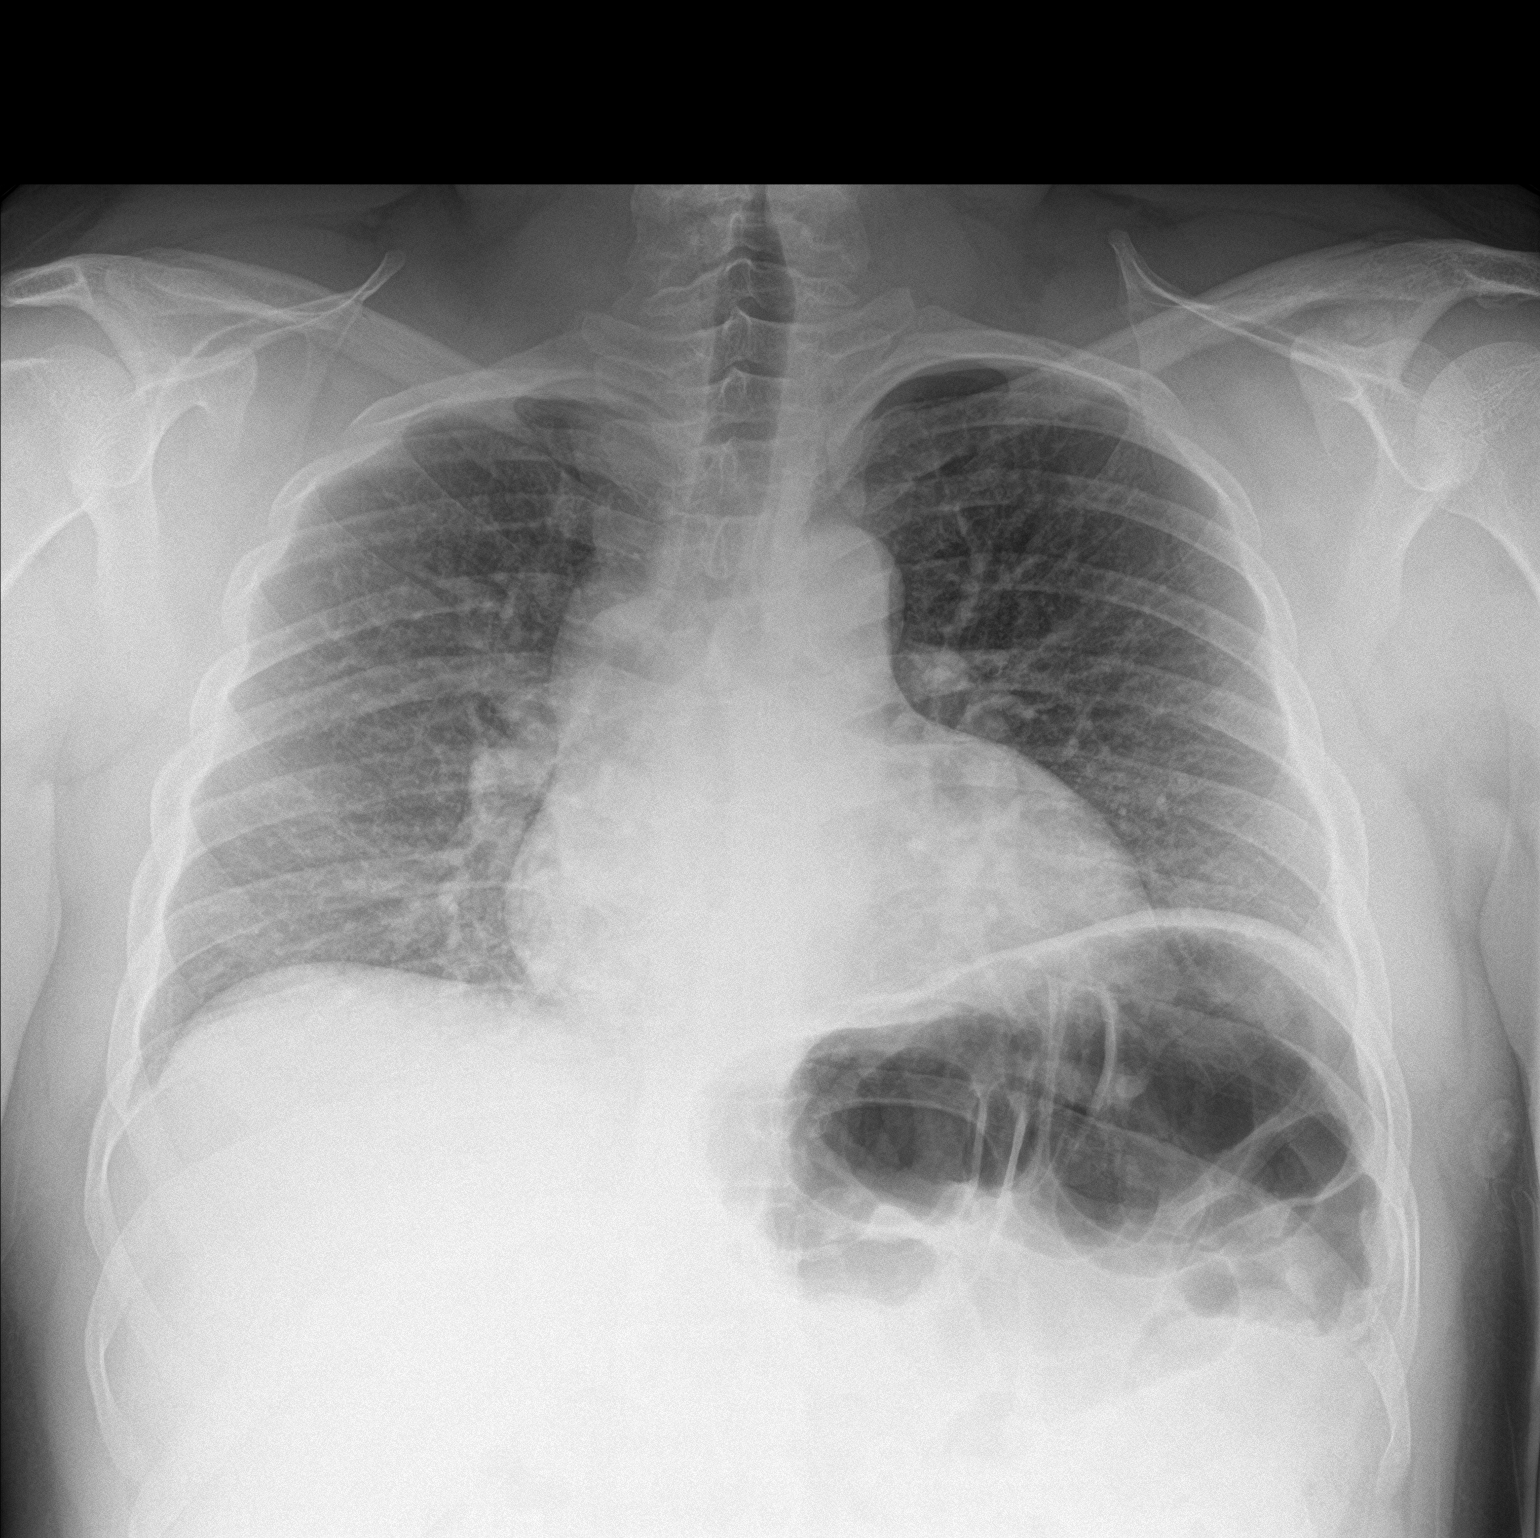

[chest lat]
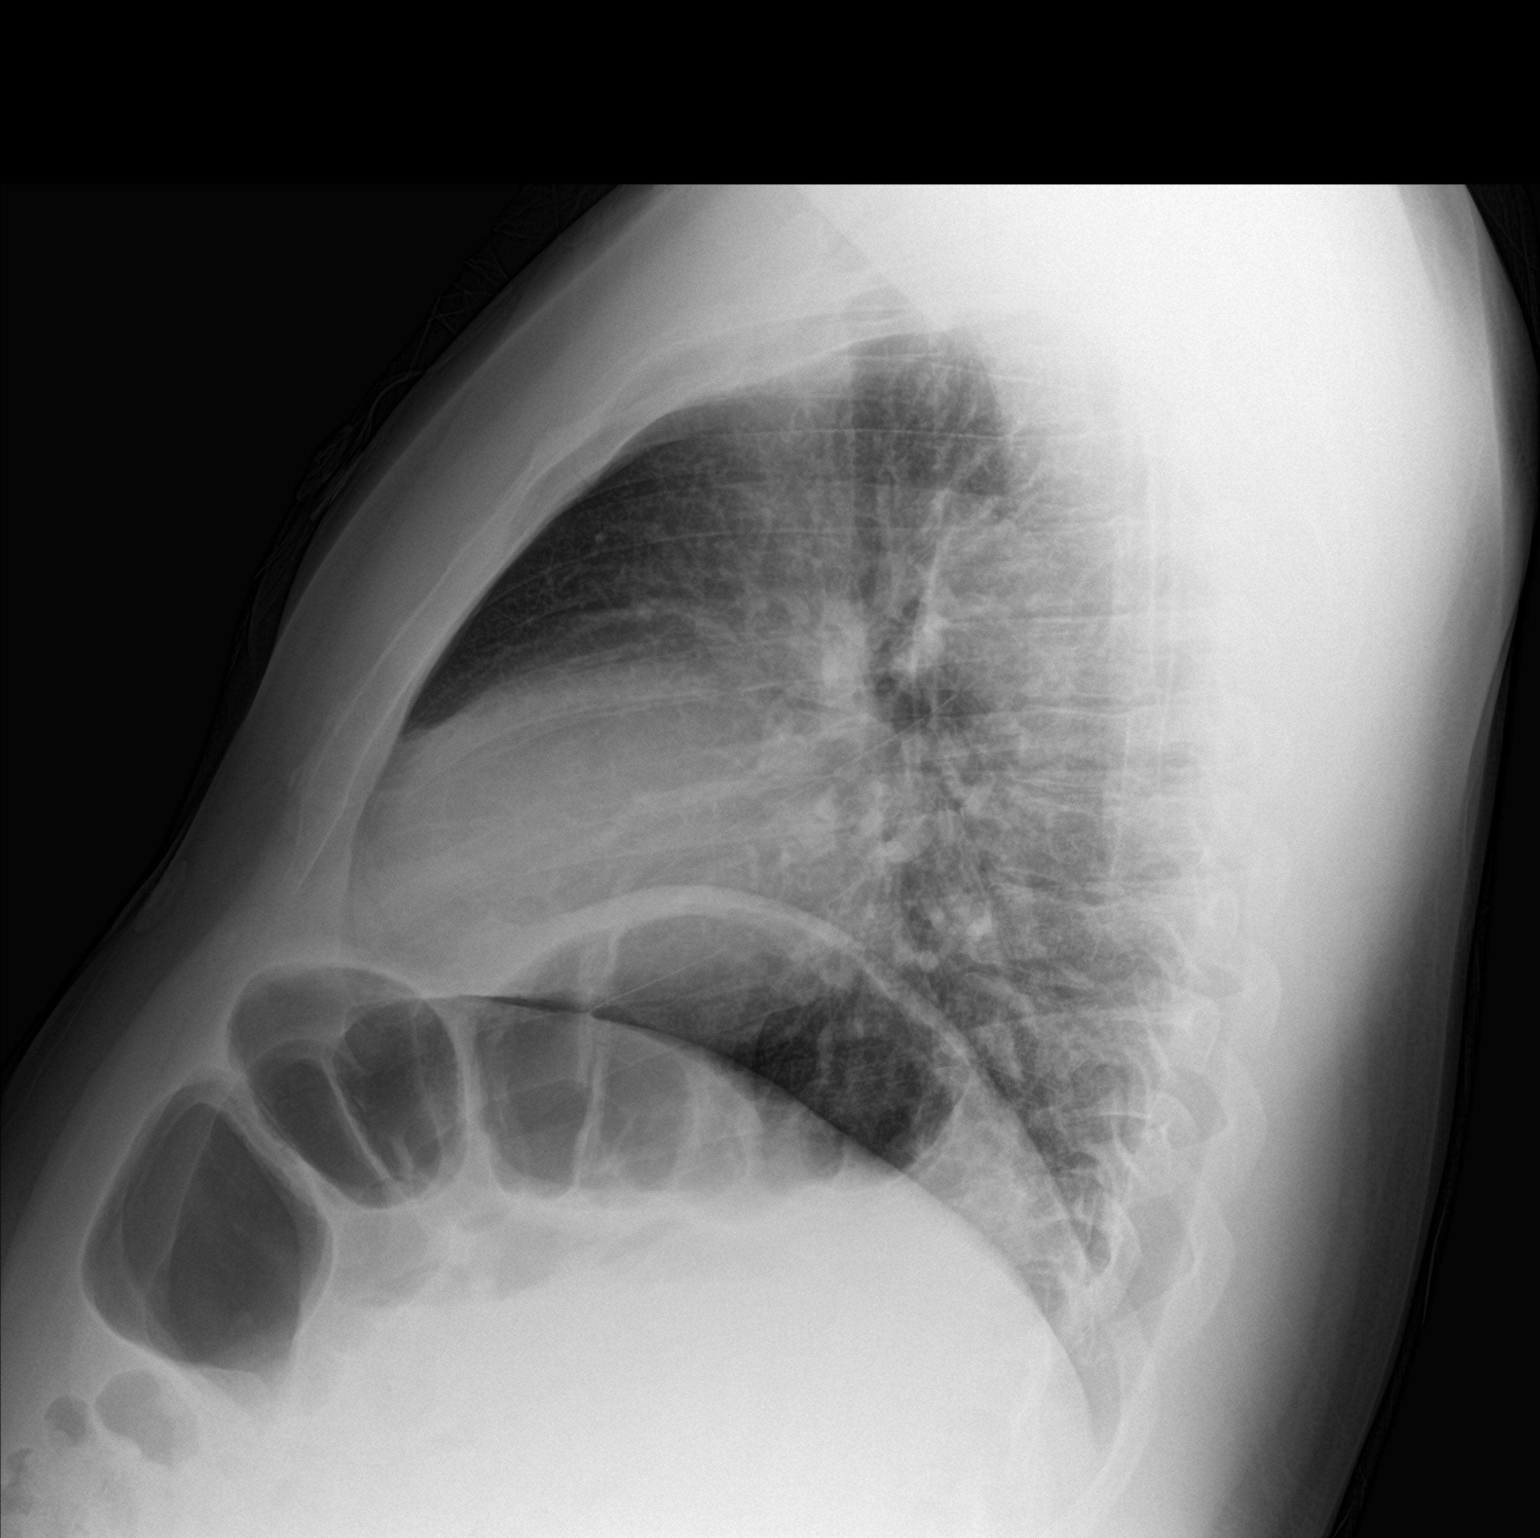

[2 of 2 positions shown; findings below may reference images not displayed]

FINDINGS: Low lung volumes leading to bronchovascular crowding. Upper normal
heart size likely accentuated by hypoaeration. No consolidation,
pleural effusion, or pneumothorax. No acute osseous abnormalities
are seen. Incidental air-filled colon in the left upper abdomen.
IMPRESSION: Hypoventilatory chest with bronchovascular crowding. Upper normal
heart size likely accentuated by hypoaeration.

## 2019-12-01 ENCOUNTER — Other Ambulatory Visit: Payer: Self-pay

## 2019-12-01 ENCOUNTER — Encounter (HOSPITAL_COMMUNITY): Payer: Self-pay | Admitting: Emergency Medicine

## 2019-12-01 ENCOUNTER — Emergency Department (HOSPITAL_COMMUNITY)
Admission: EM | Admit: 2019-12-01 | Discharge: 2019-12-01 | Disposition: A | Discharge status: Home / Self Care | Payer: Self-pay | Attending: Emergency Medicine | Admitting: Emergency Medicine

## 2019-12-01 DIAGNOSIS — Z20828 Contact with and (suspected) exposure to other viral communicable diseases: Secondary | ICD-10-CM | POA: Insufficient documentation

## 2019-12-01 DIAGNOSIS — Z20822 Contact with and (suspected) exposure to covid-19: Secondary | ICD-10-CM

## 2019-12-01 LAB — NOVEL CORONAVIRUS, NAA (HOSP ORDER, SEND-OUT TO REF LAB; TAT 18-24 HRS): SARS-CoV-2, NAA: NOT DETECTED

## 2019-12-01 NOTE — Discharge Instructions (Signed)
You have a Covid test pending that should take 2 to 3 days to return, please quarantine at home until you get your results, if positive you will be called by phone, if negative you can view your results online through [place].  Monitor for any symptoms of Covid such as fevers, cough, shortness of breath, diarrhea, loss of taste or smell, if you develop any new or worsening symptoms follow-up with your PCP or return to the ED.

## 2019-12-01 NOTE — ED Provider Notes (Signed)
[facility] EMERGENCY DEPARTMENT Provider Note   CSN: [id number] Arrival date & time: 12/01/19  5573     History Chief Complaint  Patient presents with  . COVID exposure- no symptoms    Chris Galloway is a 33 y.o. male.  Chris Galloway is a 33 y.o. male who is otherwise healthy, presents to the ED for evaluation after Covid exposure.  Patient states that he was informed yesterday that one of his coworkers tested positive for COVID-19.  They did not tell him which team member so he is not sure if he has been in close contact with this person.  States that the person was tested on Wednesday and has been at work intermittently over the past week.  Patient states that he is not currently having any symptoms, denies fevers or chills, body aches, loss of taste or smell, cough or rhinorrhea, sore throat, shortness of breath, vomiting or diarrhea.  He does not know of any other COVID-19 exposures.  Patient states that he was instructed by his manager to come in and be tested.  No other aggravating or alleviating factors.        History reviewed. No pertinent past medical history.  There are no problems to display for this patient.   History reviewed. No pertinent surgical history.     No family history on file.  Social History   Tobacco Use  . Smoking status: Never Smoker  . Smokeless tobacco: Never Used  Substance Use Topics  . Alcohol use: No  . Drug use: No    Home Medications Prior to Admission medications   Medication Sig Start Date End Date Taking? Authorizing Provider  naproxen (NAPROSYN) 375 MG tablet Take 1 tablet (375 mg total) by mouth 2 (two) times daily. Patient not taking: Reported on 05/05/2018 04/18/17   Emi Holes, PA-C    Allergies    Patient has no known allergies.  Review of Systems   Review of Systems  Constitutional: Negative for chills and fever.  HENT: Negative for congestion, rhinorrhea and sore throat.   Respiratory:  Negative for cough and shortness of breath.   Cardiovascular: Negative for chest pain.  Gastrointestinal: Negative for diarrhea, nausea and vomiting.  Musculoskeletal: Negative for myalgias.  Neurological: Negative for headaches.  All other systems reviewed and are negative.   Physical Exam Updated Vital Signs BP (!) 170/121 (BP Location: Right Arm)   Pulse 84   Temp 98.4 F (36.9 C) (Oral)   Resp 16   SpO2 100%   Physical Exam Vitals and nursing note reviewed.  Constitutional:      General: He is not in acute distress.    Appearance: Normal appearance. He is well-developed. He is not ill-appearing or diaphoretic.     Comments: Well-appearing, and in no distress  HENT:     Head: Normocephalic and atraumatic.     Mouth/Throat:     Mouth: Mucous membranes are moist.     Pharynx: Oropharynx is clear.  Eyes:     General:        Right eye: No discharge.        Left eye: No discharge.  Cardiovascular:     Rate and Rhythm: Normal rate and regular rhythm.     Heart sounds: Normal heart sounds. No murmur. No friction rub. No gallop.   Pulmonary:     Effort: Pulmonary effort is normal. No respiratory distress.     Breath sounds: Normal breath sounds.  Comments: Respirations equal and unlabored, patient able to speak in full sentences, lungs clear to auscultation bilaterally Abdominal:     General: Abdomen is flat. Bowel sounds are normal. There is no distension.     Palpations: Abdomen is soft. There is no mass.     Tenderness: There is no abdominal tenderness. There is no guarding.     Comments: Respirations equal and unlabored, patient able to speak in full sentences, lungs clear to auscultation bilaterally  Musculoskeletal:        General: No deformity.  Skin:    General: Skin is warm and dry.  Neurological:     Mental Status: He is alert and oriented to person, place, and time.     Coordination: Coordination normal.  Psychiatric:        Mood and Affect: Mood normal.         Behavior: Behavior normal.     ED Results / Procedures / Treatments   Labs (all labs ordered are listed, but only abnormal results are displayed) Labs Reviewed  NOVEL CORONAVIRUS, NAA (HOSP ORDER, SEND-OUT TO REF LAB; TAT 18-24 HRS)    EKG None  Radiology No results found.  Procedures Procedures (including critical care time)  Medications Ordered in ED Medications - No data to display  ED Course  I have reviewed the triage vital signs and the nursing notes.  Pertinent labs & imaging results that were available during my care of the patient were reviewed by me and considered in my medical decision making (see chart for details).    MDM Rules/Calculators/A&P                      33 year old male sent to the ED for Covid testing after exposure to coworker, he is not experiencing any symptoms and on arrival aside from hypertension has normal vitals and is well-appearing.  Send out Covid test collected, discussed quarantine at home until results have returned and discussed signs and symptoms to look out for.  Patient expresses understanding and agreement.  Discharged home in good condition.  Chris Galloway was evaluated in Emergency Department on 12/01/2019 for the symptoms described in the history of present illness. He was evaluated in the context of the global COVID-19 pandemic, which necessitated consideration that the patient might be at risk for infection with the SARS-CoV-2 virus that causes COVID-19. Institutional protocols and algorithms that pertain to the evaluation of patients at risk for COVID-19 are in a state of rapid change based on information released by regulatory bodies including the CDC and federal and state organizations. These policies and algorithms were followed during the patient's care in the ED.  Final Clinical Impression(s) / ED Diagnoses Final diagnoses:  Exposure to COVID-19 virus    Rx / DC Orders ED Discharge Orders    None       Jacqlyn Larsen, [place] 12/01/19 1000    Drenda Freeze, MD 12/01/19 [phone]

## 2019-12-01 NOTE — ED Triage Notes (Signed)
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  Denies any symptoms.

## 2022-11-16 ENCOUNTER — Other Ambulatory Visit: Payer: Self-pay

## 2022-11-16 ENCOUNTER — Emergency Department (HOSPITAL_COMMUNITY)
Admission: EM | Admit: 2022-11-16 | Discharge: 2022-11-16 | Disposition: A | Discharge status: Home / Self Care | Payer: No Typology Code available for payment source | Attending: Emergency Medicine | Admitting: Emergency Medicine

## 2022-11-16 ENCOUNTER — Emergency Department (HOSPITAL_COMMUNITY): Payer: Self-pay

## 2022-11-16 ENCOUNTER — Encounter (HOSPITAL_COMMUNITY): Payer: Self-pay | Admitting: Emergency Medicine

## 2022-11-16 DIAGNOSIS — Y990 Civilian activity done for income or pay: Secondary | ICD-10-CM | POA: Insufficient documentation

## 2022-11-16 DIAGNOSIS — M25572 Pain in left ankle and joints of left foot: Secondary | ICD-10-CM | POA: Diagnosis not present

## 2022-11-16 DIAGNOSIS — W19XXXA Unspecified fall, initial encounter: Secondary | ICD-10-CM | POA: Insufficient documentation

## 2022-11-16 NOTE — ED Triage Notes (Signed)
Pt reports left ankle pain after twisting his ankle when climbing out of the freezer at work.

## 2022-11-16 NOTE — ED Provider Triage Note (Signed)
Emergency Medicine Provider Triage Evaluation Note  Chris Galloway , a 36 y.o. male  was evaluated in triage.  Pt complains of left ankle/ foot injury at work. No numbness, weakness, redness.  Review of Systems  Positive: Left ankle, foot injury Negative:   Physical Exam  BP (!) 167/112 (BP Location: Left Arm)   Pulse 61   Temp 98.1 F (36.7 C) (Oral)   Resp 20   SpO2 97%  Gen:   Awake, no distress   Resp:  Normal effort  MSK:   Moves extremities without difficulty  Other:    Medical Decision Making  Medically screening exam initiated at 6:40 PM.  Appropriate orders placed.  Irma Delancey was informed that the remainder of the evaluation will be completed by another provider, this initial triage assessment does not replace that evaluation, and the importance of remaining in the ED until their evaluation is complete.  Left ankle injury   Wiley Magan A, PA-C 11/16/22 1841

## 2022-11-16 NOTE — ED Provider Notes (Signed)
[facility]-EMERGENCY DEPT Provider Note   CSN: [id number] Arrival date & time: 11/16/22  1648     History  Chief Complaint  Patient presents with   Ankle Pain    Chris Galloway is a 36 y.o. male past medical history here for ration of left ankle pain after climbing out of a freezer at work.  Some tenderness to his left lateral aspect foot as well.  Pain worse with range of motion and ambulating.  No redness, warmth.  Some mild soft tissue swelling to left lateral malleolus.  No numbness or weakness.  No knee, proximal tib-fib.  HPI     Home Medications Prior to Admission medications   Medication Sig Start Date End Date Taking? Authorizing Provider  naproxen (NAPROSYN) 375 MG tablet Take 1 tablet (375 mg total) by mouth 2 (two) times daily. Patient not taking: Reported on 05/05/2018 04/18/17   Emi Holes, PA-C      Allergies    Patient has no known allergies.    Review of Systems   Review of Systems  Constitutional: Negative.   HENT: Negative.    Respiratory: Negative.    Cardiovascular: Negative.   Gastrointestinal: Negative.   Genitourinary: Negative.   Musculoskeletal:        Left ankle pain  Skin:        swelling  Neurological: Negative.   All other systems reviewed and are negative.   Physical Exam Updated Vital Signs BP (!) 167/112 (BP Location: Left Arm)   Pulse 61   Temp 98.1 F (36.7 C) (Oral)   Resp 20   SpO2 97%  Physical Exam Vitals and nursing note reviewed.  Constitutional:      General: He is not in acute distress.    Appearance: He is well-developed. He is not ill-appearing, toxic-appearing or diaphoretic.  HENT:     Head: Atraumatic.  Eyes:     Pupils: Pupils are equal, round, and reactive to light.  Cardiovascular:     Rate and Rhythm: Normal rate and regular rhythm.     Pulses: Normal pulses.          Dorsalis pedis pulses are 2+ on the left side.       Posterior tibial pulses are 2+ on the left side.   Pulmonary:     Effort: Pulmonary effort is normal. No respiratory distress.  Abdominal:     General: There is no distension.     Palpations: Abdomen is soft.  Musculoskeletal:        General: Normal range of motion.     Cervical back: Normal range of motion and neck supple.       Feet:     Comments: Diffuse tenderness about left lateral malleolus.  Able to plantarflex, dorsiflex.  Pain worse with inversion.  Nontender midshaft, proximal tib-fib.  Nontender distal foot.  Nontender knee.  Full range of motion at knee without difficulty.  Skin:    General: Skin is warm and dry.     Capillary Refill: Capillary refill takes less than 2 seconds.     Comments: Soft tissue swelling about left lateral malleolus.  No erythema, warmth.  No obvious large joint effusion  Neurological:     General: No focal deficit present.     Mental Status: He is alert and oriented to person, place, and time.     Comments: ambulatory with a limp Decreased strength with plantarflexion likely secondary to pain     ED Results / Procedures /  Treatments   Labs (all labs ordered are listed, but only abnormal results are displayed) Labs Reviewed - No data to display  EKG None  Radiology DG Foot Complete Left  Result Date: 11/16/2022 CLINICAL DATA:  Left ankle pain after twisting EXAM: LEFT FOOT - COMPLETE 3+ VIEW COMPARISON:  Radiographs 09/11/2009 FINDINGS: There is no evidence of fracture or dislocation. There is no evidence of arthropathy or other focal bone abnormality. Soft tissues are unremarkable. IMPRESSION: Negative. Electronically Signed   By: Minerva Fester M.D.   On: 11/16/2022 20:30   DG Ankle Complete Left  Result Date: 11/16/2022 CLINICAL DATA:  Pain and swelling, injury. EXAM: LEFT ANKLE COMPLETE - 3+ VIEW COMPARISON:  None Available. FINDINGS: There is no evidence of fracture, dislocation, or joint effusion. There is no evidence of arthropathy or other focal bone abnormality. There is soft  tissue swelling of the ankle. IMPRESSION: No acute fracture or dislocation. Soft tissue swelling of the ankle. Electronically Signed   By: Darliss Cheney M.D.   On: 11/16/2022 20:29    Procedures .Ortho Injury Treatment  Date/Time: 11/16/2022 9:18 PM  Performed by: Ralph Leyden A, PA-C Authorized by: Linwood Dibbles, PA-C   Consent:    Consent obtained:  Verbal   Consent given by:  Patient   Risks discussed:  Fracture, nerve damage, restricted joint movement, vascular damage, stiffness, recurrent dislocation and irreducible dislocation   Alternatives discussed:  No treatment, alternative treatment, immobilization, referral and delayed treatmentInjury location: ankle Location details: left ankle Injury type: soft tissue Pre-procedure neurovascular assessment: neurovascularly intact Pre-procedure distal perfusion: normal Pre-procedure neurological function: normal Pre-procedure range of motion: normal  Anesthesia: Local anesthesia used: no  Patient sedated: NoImmobilization: crutches and splint Splint Applied by: Milon Dikes Post-procedure neurovascular assessment: post-procedure neurovascularly intact Post-procedure distal perfusion: normal Post-procedure neurological function: normal Post-procedure range of motion: normal       Medications Ordered in ED Medications - No data to display  ED Course/ Medical Decision Making/ A&P    36 year old here for evaluation of left ankle pain after injury at work [employer].  Did not fall to ground.  Patient neurovascularly intact.  He has some tenderness to his left lateral malleolus however no erythema, warmth.  Nontender midshaft, proximal tib-fib.  Full range of motion at knee.  Ambulatory with a limp.  Will plan on imaging  Imaging personally viewed and interpreted:  Xray left foot, left ankle without fracture, dislocation or effusion  Patient reassessed.  Suspect sprain or strain.  He was placed in ASO brace, crutches.  He will  follow-up outpatient orthopedics.  The patient has been appropriately medically screened and/or stabilized in the ED. I have low suspicion for any other emergent medical condition which would require further screening, evaluation or treatment in the ED or require inpatient management.  Patient is hemodynamically stable and in no acute distress.  Patient able to ambulate in department prior to ED.  Evaluation does not show acute pathology that would require ongoing or additional emergent interventions while in the emergency department or further inpatient treatment.  I have discussed the diagnosis with the patient and answered all questions.  Pain is been managed while in the emergency department and patient has no further complaints prior to discharge.  Patient is comfortable with plan discussed in room and is stable for discharge at this time.  I have discussed strict return precautions for returning to the emergency department.  Patient was encouraged to follow-up with PCP/specialist refer to  at discharge.                            Medical Decision Making Amount and/or Complexity of Data Reviewed External Data Reviewed: labs, radiology and notes. Radiology: ordered and independent interpretation performed. Decision-making details documented in ED Course.  Risk OTC drugs. Prescription drug management. Decision regarding hospitalization. Diagnosis or treatment significantly limited by social determinants of health.          Final Clinical Impression(s) / ED Diagnoses Final diagnoses:  Fall, initial encounter  Acute left ankle pain    Rx / DC Orders ED Discharge Orders     None         Caydyn Sprung A, PA-C 11/16/22 2119    Lorre Nick, MD 11/17/22 1538

## 2022-11-16 NOTE — Progress Notes (Signed)
Orthopedic Tech Progress Note Patient Details:  Chris Galloway [birth year] [id number]  Ortho Devices Type of Ortho Device: ASO, Crutches Ortho Device/Splint Location: LLE Ortho Device/Splint Interventions: Application   Post Interventions Patient Tolerated: Well  [name] Chris Galloway 11/16/2022, 9:16 PM

## 2022-11-16 NOTE — Discharge Instructions (Addendum)
Tylenol, motrin as needed for pain.  Ice and elevate  Follow up with orthopedics  Return for new or worsening symptoms

## 2022-12-29 ENCOUNTER — Ambulatory Visit (INDEPENDENT_AMBULATORY_CARE_PROVIDER_SITE_OTHER): Payer: No Typology Code available for payment source

## 2022-12-29 ENCOUNTER — Ambulatory Visit (HOSPITAL_COMMUNITY)
Admission: EM | Admit: 2022-12-29 | Discharge: 2022-12-29 | Disposition: A | Discharge status: Home / Self Care | Payer: No Typology Code available for payment source | Attending: Physician Assistant | Admitting: Physician Assistant

## 2022-12-29 ENCOUNTER — Encounter (HOSPITAL_COMMUNITY): Payer: Self-pay

## 2022-12-29 ENCOUNTER — Ambulatory Visit (HOSPITAL_COMMUNITY): Payer: Commercial Managed Care - HMO

## 2022-12-29 DIAGNOSIS — I10 Essential (primary) hypertension: Secondary | ICD-10-CM | POA: Diagnosis not present

## 2022-12-29 DIAGNOSIS — M79672 Pain in left foot: Secondary | ICD-10-CM

## 2022-12-29 DIAGNOSIS — M25572 Pain in left ankle and joints of left foot: Secondary | ICD-10-CM | POA: Diagnosis not present

## 2022-12-29 DIAGNOSIS — S99922D Unspecified injury of left foot, subsequent encounter: Secondary | ICD-10-CM | POA: Diagnosis not present

## 2022-12-29 MED ORDER — AMLODIPINE BESYLATE 5 MG PO TABS: 5.0000 mg | ORAL_TABLET | Freq: Every day | ORAL | 0 refills | Status: AC

## 2022-12-29 NOTE — Discharge Instructions (Signed)
Your x-ray was normal with no evidence of a fracture.  I am suspecting that it will just take a little bit longer to heal.  Keep your foot elevated and continue using the brace.  Use ice when you are at home.  Given you continue to have pain I do recommend you follow-up with the podiatrist.  Please call them to schedule an appointment.  If anything worsens return for reevaluation including increasing pain, numbness or tingling in your foot, swelling of your foot, trouble walking.  Your blood pressure was elevated.  Start amlodipine 5 mg daily.  Please avoid NSAIDs (aspirin, ibuprofen/Advil, naproxen/Aleve), decongestants, caffeine, sodium.  Monitor your blood pressure at home.  Follow-up with primary care next week as scheduled.  If you develop any chest pain, shortness of breath, headache, vision change, dizziness in the setting of high blood pressure you need to go to the emergency room.

## 2022-12-29 NOTE — ED Triage Notes (Signed)
Pt is here for follow ankle injury

## 2022-12-29 NOTE — ED Provider Notes (Signed)
[place]    CSN: [id number] Arrival date & time: 12/29/22  1221      History   Chief Complaint Chief Complaint  Patient presents with   Follow-up    HPI Chris Galloway is a 37 y.o. male.   Patient presents today for follow-up of left ankle/foot injury.  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  He was seen in the [facility] emergency room and placed in a brace.  He was given Naprosyn but this was ineffective in managing the pain.  X-rays of the foot and ankle were obtained that showed soft tissue swelling without osseous abnormality.  He reports that pain has improved but he continues to have discomfort particularly with ambulation.  Denies any pain at rest but reports that pain is moderate with attempted ambulation, localized to the lateral left ankle with radiation into dorsal foot, described as aching, no aggravating or alleviating factors identified.  He has not followed up with a specialist.  Denies previous injury or surgery involving the ankle.  He is able to bear weight without difficulty but must ambulate more slowly as a result of ongoing discomfort.  Denies any numbness or paresthesias in the foot.  Patient's blood pressure is elevated today.  He reports a remote history of hypertension previously treated with lisinopril and atenolol.  He has not been taking medication for a while as these medications caused him to have headaches.  He does not currently have a primary care but is scheduled to establish with them first thing next week.  He denies any current chest pain, shortness of breath, headache, vision change, dizziness.  Denies any decongestants, NSAIDs, caffeine, sodium.    History reviewed. No pertinent past medical history.  There are no problems to display for this patient.   History reviewed. No pertinent surgical  history.     Home Medications    Prior to Admission medications   Medication Sig Start Date End Date Taking? Authorizing Provider  amLODipine (NORVASC) 5 MG tablet Take 1 tablet (5 mg total) by mouth daily. 12/29/22  Yes Shade Rivenbark, Derry Skill, PA-C    Family History History reviewed. No pertinent family history.  Social History Social History   Tobacco Use   Smoking status: Never   Smokeless tobacco: Never  Substance Use Topics   Alcohol use: No   Drug use: No     Allergies   Patient has no known allergies.   Review of Systems Review of Systems  Constitutional:  Positive for activity change. Negative for appetite change, fatigue and fever.  Eyes:  Negative for visual disturbance.  Respiratory:  Negative for shortness of breath.   Cardiovascular:  Negative for chest pain.  Musculoskeletal:  Positive for arthralgias, gait problem and joint swelling. Negative for myalgias.  Skin:  Negative for color change and wound.  Neurological:  Negative for dizziness, weakness, numbness and headaches.     Physical Exam Triage Vital Signs ED Triage Vitals  Enc Vitals Group     BP 12/29/22 1428 (!) 165/120     Pulse Rate 12/29/22 1428 83     Resp 12/29/22 1428 16     Temp 12/29/22 1428 98.8 F (37.1 C)     Temp Source 12/29/22 1428 Oral     SpO2 12/29/22 1428 95 %     Weight --  Height --      Head Circumference --      Peak Flow --      Pain Score 12/29/22 1427 0     Pain Loc --      Pain Edu? --      Excl. in [place]? --    No data found.  Updated Vital Signs BP (!) 162/128 (BP Location: Left Arm)   Pulse 82   Temp 98.3 F (36.8 C) (Oral)   Resp 16   SpO2 95%   Visual Acuity Right Eye Distance:   Left Eye Distance:   Bilateral Distance:    Right Eye Near:   Left Eye Near:    Bilateral Near:     Physical Exam Vitals reviewed.  Constitutional:      General: He is awake.     Appearance: Normal appearance. He is well-developed. He is not ill-appearing.      Comments: Very pleasant male appears stated age in no acute distress sitting comfortably in exam room  HENT:     Head: Normocephalic and atraumatic.  Cardiovascular:     Rate and Rhythm: Normal rate and regular rhythm.     Heart sounds: Normal heart sounds, S1 normal and S2 normal. No murmur heard.    Comments: Capillary refill within 2 seconds left toes Pulmonary:     Effort: Pulmonary effort is normal.     Breath sounds: Normal breath sounds. No stridor. No wheezing, rhonchi or rales.     Comments: Clear to auscultation bilaterally Musculoskeletal:     Left ankle: No swelling. Tenderness present. Normal range of motion.     Left foot: Normal range of motion. Tenderness present. No deformity.       Legs:  Feet:     Left foot:     Protective Sensation: 10 sites tested.  10 sites sensed.     Skin integrity: Skin integrity normal.     Toenail Condition: Left toenails are normal.  Neurological:     Mental Status: He is alert.  Psychiatric:        Behavior: Behavior is cooperative.      UC Treatments / Results  Labs (all labs ordered are listed, but only abnormal results are displayed) Labs Reviewed - No data to display  EKG   Radiology DG Foot Complete Left  Result Date: 12/29/2022 CLINICAL DATA:  Left foot pain after injury 6 weeks ago. EXAM: LEFT FOOT - COMPLETE 3+ VIEW COMPARISON:  November 16, 2022. FINDINGS: There is no evidence of fracture or dislocation. There is no evidence of arthropathy or other focal bone abnormality. Soft tissues are unremarkable. IMPRESSION: Negative. Electronically Signed   By: Marijo Conception M.D.   On: 12/29/2022 15:25   DG Ankle Complete Left  Result Date: 12/29/2022 CLINICAL DATA:  Left ankle pain after injury 6 weeks ago. EXAM: LEFT ANKLE COMPLETE - 3+ VIEW COMPARISON:  November 16, 2022. FINDINGS: There is no evidence of fracture, dislocation, or joint effusion. There is no evidence of arthropathy or other focal bone abnormality. Soft  tissues are unremarkable. IMPRESSION: Negative. Electronically Signed   By: Marijo Conception M.D.   On: 12/29/2022 15:23    Procedures Procedures (including critical care time)  Medications Ordered in UC Medications - No data to display  Initial Impression / Assessment and Plan / UC Course  I have reviewed the triage vital signs and the nursing notes.  Pertinent labs & imaging results that were available during my care of  the patient were reviewed by me and considered in my medical decision making (see chart for details).     X-rays were obtained given continued focal bony tenderness which showed no acute osseous abnormality.  Recommended that he continue with RICE protocol to help manage symptoms.  Given ongoing pain recommended that he follow-up with podiatry and was given contact information for local provider with instruction to call to schedule an appointment.  Offered prescription NSAID to help with pain but patient reports that these are generally ineffective and so declined this today.  Discussed that if he has any worsening or changing symptoms he needs to be reevaluated immediately including worsening pain, difficulty ambulating, numbness, paresthesias, swelling.  Strict return precautions given.  Blood pressure is very elevated today.  Patient is open to restarting antihypertensive medications.  Given side effects with lisinopril/atenolol will start amlodipine 5 mg.  He was encouraged to avoid NSAIDs, caffeine, sodium, decongestants.  He has follow-up with primary care next week, strong encouraged to keep this appointment.  Discussed that if he develops any chest pain, shortness of breath, headache, vision change, dizziness in setting of high blood pressure he needs to go to the emergency room.  Strict return precautions given.  Final Clinical Impressions(s) / UC Diagnoses   Final diagnoses:  Left foot pain  Foot injury, left, subsequent encounter  Elevated blood pressure reading  with diagnosis of hypertension     Discharge Instructions      Your x-ray was normal with no evidence of a fracture.  I am suspecting that it will just take a little bit longer to heal.  Keep your foot elevated and continue using the brace.  Use ice when you are at home.  Given you continue to have pain I do recommend you follow-up with the podiatrist.  Please call them to schedule an appointment.  If anything worsens return for reevaluation including increasing pain, numbness or tingling in your foot, swelling of your foot, trouble walking.  Your blood pressure was elevated.  Start amlodipine 5 mg daily.  Please avoid NSAIDs (aspirin, ibuprofen/Advil, naproxen/Aleve), decongestants, caffeine, sodium.  Monitor your blood pressure at home.  Follow-up with primary care next week as scheduled.  If you develop any chest pain, shortness of breath, headache, vision change, dizziness in the setting of high blood pressure you need to go to the emergency room.      ED Prescriptions     Medication Sig Dispense Auth. Provider   amLODipine (NORVASC) 5 MG tablet Take 1 tablet (5 mg total) by mouth daily. 30 tablet Carnisha Feltz, Noberto Retort, PA-C      PDMP not reviewed this encounter.   Jeani Hawking, PA-C 12/29/22 1547
